# Patient Record
Sex: Female | Born: 1990 | Race: Black or African American | Hispanic: No | Marital: Single | Smoking: Current every day smoker
Health system: Southern US, Community
[De-identification: ages and names within clinical notes are randomized; demographics above are authoritative.]

## PROBLEM LIST (undated history)

## (undated) DIAGNOSIS — J45909 Unspecified asthma, uncomplicated: Secondary | ICD-10-CM

---
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
## 2012-03-28 ENCOUNTER — Emergency Department: Payer: Self-pay | Admitting: Emergency Medicine

## 2014-04-02 ENCOUNTER — Emergency Department (HOSPITAL_COMMUNITY)
Admission: EM | Admit: 2014-04-02 | Discharge: 2014-04-02 | Disposition: A | Discharge status: Home / Self Care | Payer: BC Managed Care – PPO | Attending: Emergency Medicine | Admitting: Emergency Medicine

## 2014-04-02 ENCOUNTER — Encounter (HOSPITAL_COMMUNITY): Payer: Self-pay | Admitting: Emergency Medicine

## 2014-04-02 DIAGNOSIS — Z880 Allergy status to penicillin: Secondary | ICD-10-CM | POA: Insufficient documentation

## 2014-04-02 DIAGNOSIS — F17200 Nicotine dependence, unspecified, uncomplicated: Secondary | ICD-10-CM | POA: Insufficient documentation

## 2014-04-02 DIAGNOSIS — M545 Low back pain, unspecified: Secondary | ICD-10-CM | POA: Insufficient documentation

## 2014-04-02 DIAGNOSIS — J45909 Unspecified asthma, uncomplicated: Secondary | ICD-10-CM | POA: Insufficient documentation

## 2014-04-02 DIAGNOSIS — Z791 Long term (current) use of non-steroidal anti-inflammatories (NSAID): Secondary | ICD-10-CM | POA: Insufficient documentation

## 2014-04-02 HISTORY — DX: Unspecified asthma, uncomplicated: J45.909

## 2014-04-02 MED ORDER — CYCLOBENZAPRINE HCL 10 MG PO TABS: 10.0000 mg | ORAL_TABLET | Freq: Two times a day (BID) | ORAL | Status: AC | PRN

## 2014-04-02 MED ORDER — MELOXICAM 7.5 MG PO TABS: 15.0000 mg | ORAL_TABLET | Freq: Every day | ORAL | Status: AC
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
## 2014-04-02 NOTE — ED Provider Notes (Signed)
Medical screening examination/treatment/procedure(s) were performed by non-physician practitioner and as supervising physician I was immediately available for consultation/collaboration.   EKG Interpretation None        Viraat Vanpatten, MD 04/02/14 1626 

## 2014-04-02 NOTE — ED Notes (Signed)
Back pain x 4 days denies injury  Or dysuria

## 2014-09-04 ENCOUNTER — Emergency Department: Payer: Self-pay | Admitting: Emergency Medicine
